# Patient Record
Sex: Male | Born: 1945
Health system: Southern US, Community
[De-identification: ages and names within clinical notes are randomized; demographics above are authoritative.]

---

## 2007-10-08 IMAGING — CR DG CHEST 2V
2 series · 2 of 2 positions shown · non-contrast
Comparison: None

CLINICAL DATA: Pre op osteoarthritis left knee

CHEST - 2 VIEW

[w chest pa]
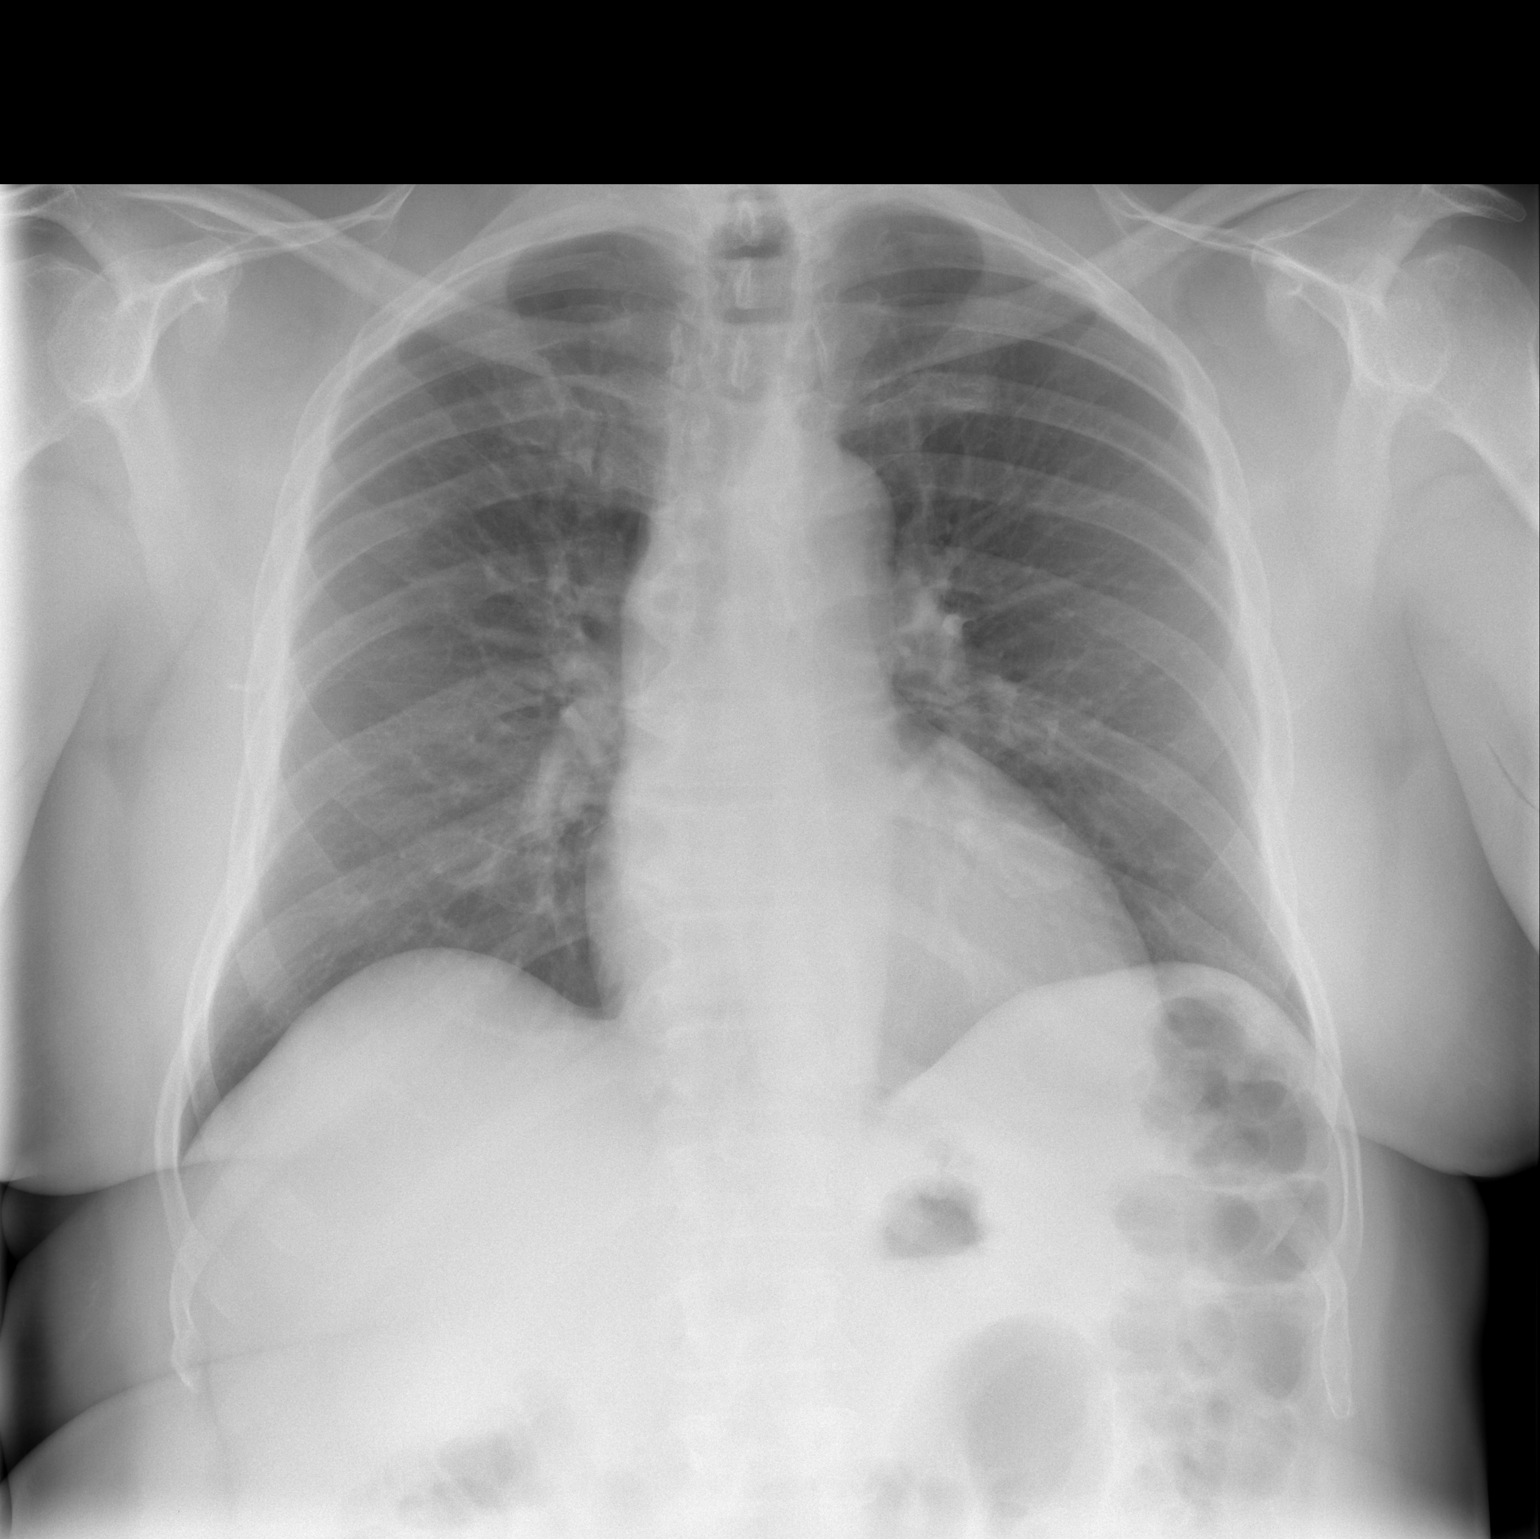

[w chest lat]
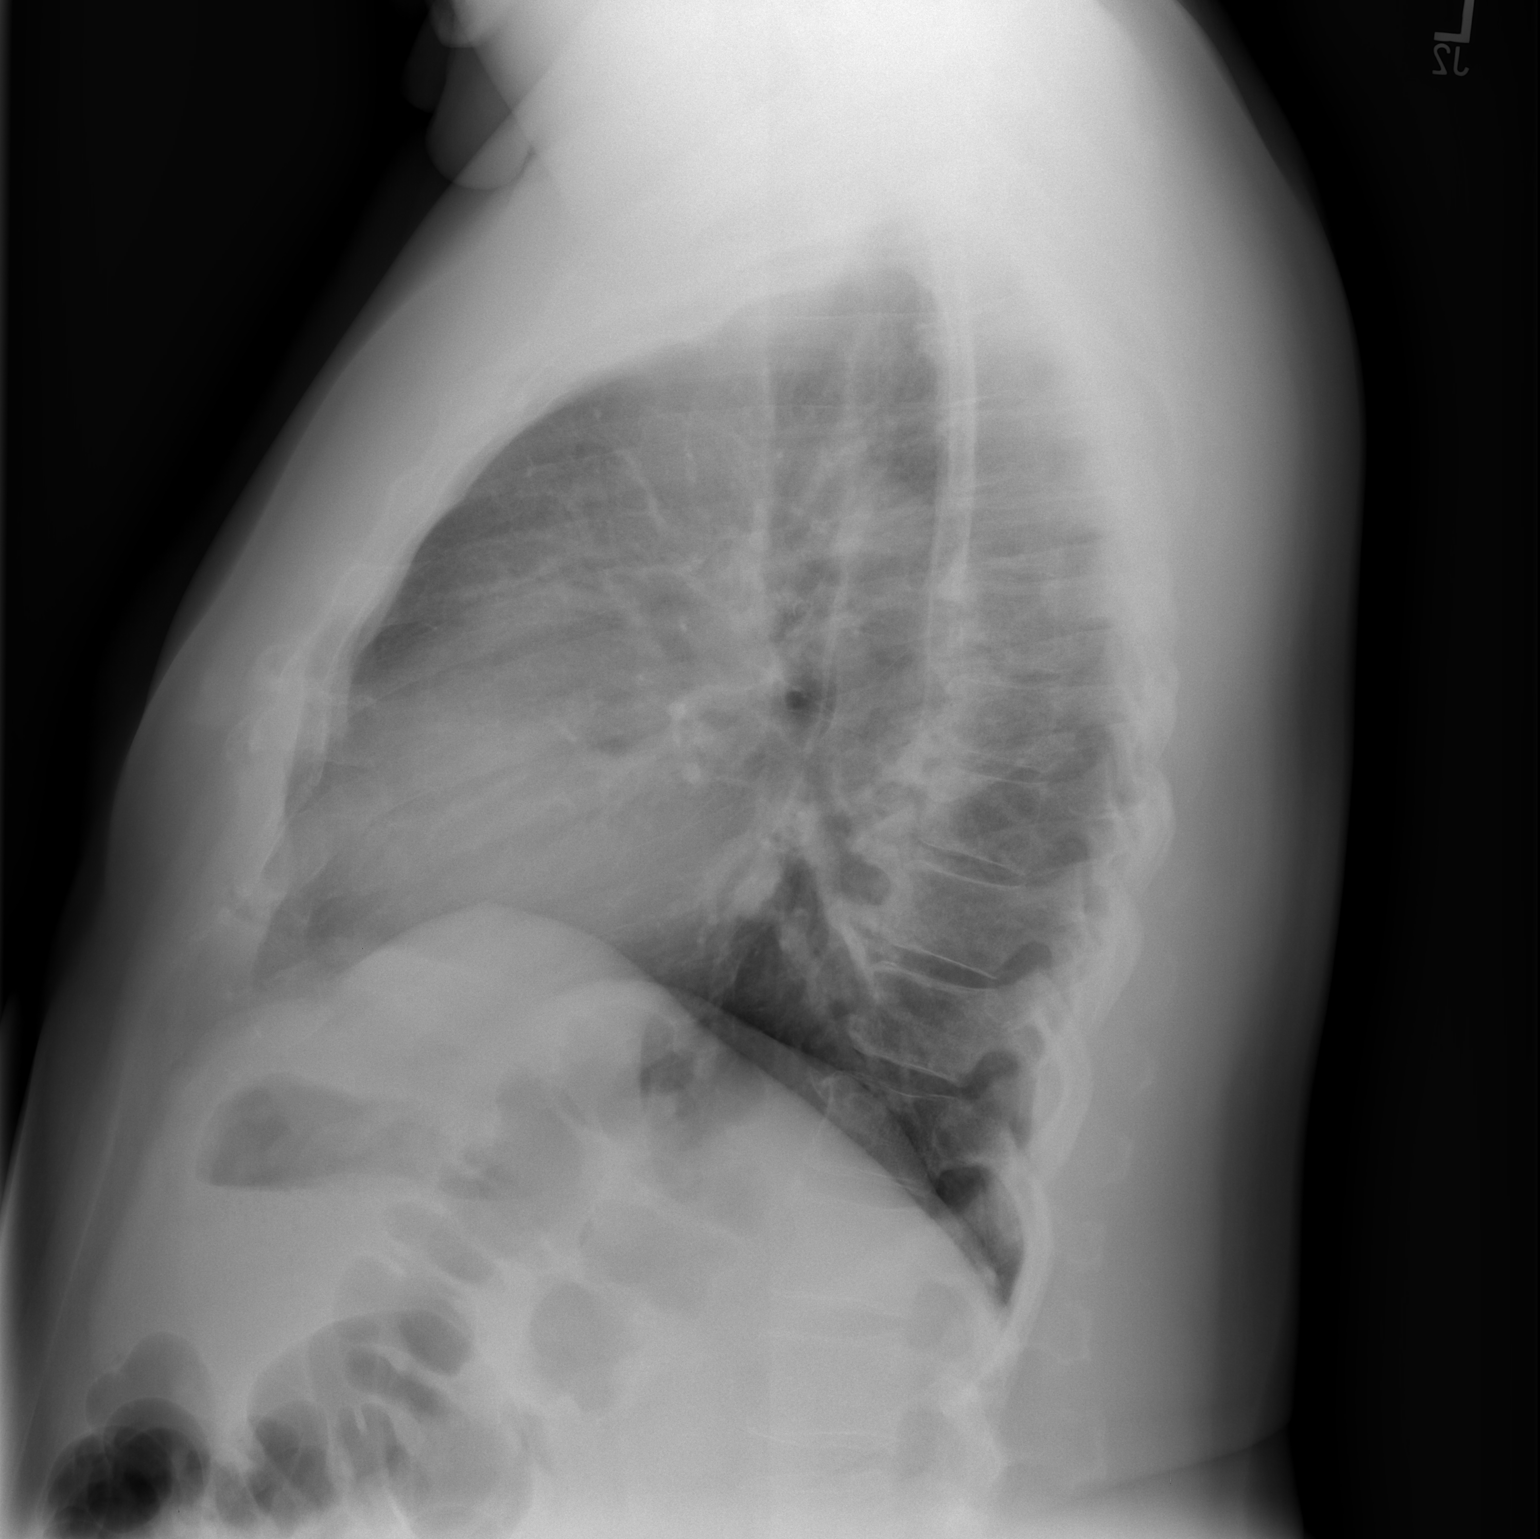

[2 of 2 positions shown; findings below may reference images not displayed]

FINDINGS: Normal mediastinum and heart silhouette.  Costophrenic
angles are clear.  No evidence of effusion, infiltrate or
pneumothorax.
IMPRESSION: 1..  No acute cardiopulmonary process.

## 2007-10-13 ENCOUNTER — Inpatient Hospital Stay (HOSPITAL_COMMUNITY): Admission: RE | Admit: 2007-10-13 | Discharge: 2007-10-16 | Payer: Self-pay | Admitting: Orthopedic Surgery

## 2010-05-23 NOTE — H&P (Signed)
James Macias, ROUTE                 ACCOUNT NO.:  [id number]   MEDICAL RECORD NO.:  [id number]          PATIENT TYPE:  INP   LOCATION:  0008                         FACILITY:  [facility]   PHYSICIAN:  Ollen Gross, M.D.    DATE OF BIRTH:  [birth year]   DATE OF ADMISSION:  10/13/2007  DATE OF DISCHARGE:                              HISTORY & PHYSICAL   CHIEF COMPLAINT:  Left knee pain.   HISTORY OF PRESENT ILLNESS:  The patient is a 65-year male who has seen  by Dr. Lequita Halt for ongoing knee problems.  He has been treated  conservatively in the past for his end-stage arthritis with injections.  He has been refractory to nonoperative management.  He is felt to be a  good candidate.  Risks and benefits have been discussed and he would  like to proceed with surgery.   ALLERGIES:  No known drug allergies.   CURRENT MEDICATIONS:  1. Actos.  2. Celebrex.  3. Lisinopril.  4. Atenolol/hydrochlorothiazide.  5. Benicar.  6. Sertraline.  7. Aspirin.   PAST MEDICAL HISTORY:  1. Early cataracts.  2. Sleep apnea which he does use a CPAP machine for.  3. Hypertension.  4. Non-insulin-dependent diabetes mellitus.  5. arthritis.   PAST SURGICAL HISTORY:  Knee scope.   FAMILY HISTORY:  Father's history is unknown.   SOCIAL HISTORY:  Married.  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  Less than a pack a day  smoker.  No alcohol.  Two children.  Does have a caregiver lined up  after surgery.  Lives in a [place] [facility].   REVIEW OF SYSTEMS:  GENERAL:  No fevers, chills or night sweats.  NEUROLOGIC:  No seizures, syncope or paralysis.  RESPIRATORY:  He does  have occasional shortness of breath at rest.  No productive cough or  hemoptysis.  CARDIOVASCULAR:  No chest pain, angina or orthopnea.  GI:  No nausea, diarrhea or constipation.  GU:  No dysuria, hematuria or  discharge.  MUSCULOSKELETAL:  Left knee.   PHYSICAL EXAMINATION:  VITAL SIGNS:  Pulse 60, respirations 14, blood  pressure 158/82.  GENERAL:  A  65 year old African American male well-nourished, well-  developed, slightly overweight in no acute distress, alert, oriented and  cooperative.  Is pleasant.  Accompanied by his wife.  HEENT:  Normocephalic, atraumatic.  Pupils equal, round and reactive.  EOM's intact.  Noted to wear glasses.  Full upper dentures.  NECK:  Supple.  CHEST:  Clear.  HEART:  Regular rate and rhythm.  No murmur.  ABDOMEN:  Soft, round, slightly protuberant abdomen.  Bowel sounds  present.  RECTAL/BREASTS/GENITALIA:  Not done.  Not pertinent to present illness  EXTREMITIES:  Left knee, trace effusion.  Range of motion 10-120.  Marked crepitus.   IMPRESSION:  Osteoarthritis left knee.   PLAN:  The patient was admitted to [facility] and will undergo  a left total knee replacement with arthroplasty.  Surgery will be  performed by Dr. Ollen Gross.      Alexzandrew L. Perkins, P.A.C.      Ollen Gross, M.D.  Electronically  Signed    ALP/MEDQ  D:  10/12/2007  T:  10/13/2007  Job:  [id number]   cc:   Dr. Mardi Mainland Card[facility]/[place], [place]

## 2010-05-23 NOTE — Op Note (Signed)
NAMESHARAD, James Macias                 ACCOUNT NO.:  [id number]   MEDICAL RECORD NO.:  [id number]          PATIENT TYPE:  INP   LOCATION:  0008                         FACILITY:  [facility]   PHYSICIAN:  Ollen Gross, M.D.    DATE OF BIRTH:  [birth year]   DATE OF PROCEDURE:  10/13/2007  DATE OF DISCHARGE:                               OPERATIVE REPORT   PREOPERATIVE DIAGNOSIS:  Osteoarthritis, left knee.   POSTOPERATIVE DIAGNOSIS:  Osteoarthritis, left knee.   PROCEDURE:  Left total knee arthroplasty.   SURGEON:  Ollen Gross, M.D.   ASSISTANT:  Jamelle Rushing, P.A.   ANESTHESIA:  General with postop Marcaine pain pump.   ESTIMATED BLOOD LOSS:  Minimal.   DRAINS:  None.   TOURNIQUET TIME:  37 minutes at 300 mmHg.   COMPLICATIONS:  None.   CONDITION:  Stable to recovery.   BRIEF CLINICAL NOTE:  James Macias is a 65 year old male with end-stage  medial compartment arthritis of his left knee with progressively  worsening pain and dysfunction.  He has had pain and recurrent  effusions.  He has failed nonoperative management and presents now for  left total knee arthroplasty.   PROCEDURE IN DETAIL:  After successful administration of a general  anesthetic, the tourniquet is placed high on his left thigh and his left  lower extremity prepped and draped in the usual sterile fashion.  The  extremity is wrapped in an Esmarch, the knee flexed, tourniquet inflated  to 300 mmHg.  A midline incision is made with a 10 blade through  subcutaneous tissue to the level of the extensor mechanism.  A fresh  blade is used to make a medial parapatellar arthrotomy.  Soft tissue  over the proximal medial tibia is subperiosteally elevated to the joint  line with a knife and into the semimembranosus bursa with a Cobb  elevator.  Soft tissue laterally is elevated with attention being paid  to avoiding the patellar tendon on the tibial tubercle.  The patella is  subluxed laterally and the knee flexed to 90  degrees, ACL and PCL are  removed.  A drill was used to create a starting hole in the distal femur  and the canal is thoroughly irrigated.  A 5-degree left valgus alignment  guide is placed and referencing off the posterior condyles, rotation is  marked and the block pinned to remove 11 mm off the distal femur.  Distal femoral resection is made with an oscillating saw.  A sizing  block is placed and a size 4 is most appropriate.  Rotation is marked at  the epicondylar axis.  A size 4 cutting block is placed and the  anterior, posterior and chamfer cuts are made.   The tibia is subluxed forward and the menisci are removed.  Extramedullary tibial alignment guide is placed referencing proximally  at the medial aspect of the tibial tubercle and distally along the  second metatarsal axis and tibial crest.  The block is pinned to remove  10 mm off the nondeficient lateral side.  Tibial resection is made with  an oscillating saw.  A size 4 is the most appropriate tibial component  and the proximal tibia is prepared with a modular drill and keel punch  for a size 4.  Femoral preparation is completed with the intercondylar  cut.   A size 4 mobile bearing tibial trial, a size 4 posterior-stabilized  femoral trial and a 10-mm posterior-stabilized rotating platform insert  trial are placed.  With the 10, full extension is achieved with  excellent varus-valgus, anterior and posterior balance throughout full  range of motion.  The patella is everted, thickness measured to be 25  mm.  Free-hand resection is taken to 13 mm, a 41 template is placed, and  lug holes are drilled, a trial patella is placed and it tracks normally.  Osteophytes are removed off the posterior femur with the trial in place.  All trials are removed and the cut bone surfaces prepared with pulsatile  lavage.  Cement is mixed and once ready for implantation, the size 4  mobile bearing tibial tray, the size 4 posterior-stabilized  femur and  the 41 patella are cemented into place and the patella is held with a  clamp.  The trial 10-mm insert is placed and the knee held in full  extension and all extruded cement removed.  When the cement had fully  hardened, then the permanent 10-mm posterior-stabilized rotating  platform insert is placed into the tibial tray.  The wound is copiously  irrigated with saline solution.  FloSeal is injected on the posterior  capsule, medial and lateral gutters and suprapatellar area.  The moist  sponge is placed and the tourniquet released with a total time of 37  minutes.  The sponge is removed after 2 minutes and minimal bleeding is  encountered.  The bleeding that is encountered is stopped with  electrocautery.  We again thoroughly irrigated the wound and closed the  arthrotomy with interrupted #1 PDS.  Flexion against gravity was 135  degrees.  Subcu is closed with interrupted 2-0 Vicryl and subcuticular  running 4-0 Monocryl.  The catheter for the Marcaine pain pump is placed  and the pump initiated.  Steri-Strips and a bulky sterile dressing are  applied and he is placed into a knee immobilizer, awakened and  transported to recovery in stable condition.      Ollen Gross, M.D.  Electronically Signed     FA/MEDQ  D:  10/13/2007  T:  10/13/2007  Job:  [id number]

## 2010-05-23 NOTE — Discharge Summary (Signed)
James Macias, James Macias                 ACCOUNT NO.:  [id number]   MEDICAL RECORD NO.:  [id number]          PATIENT TYPE:  INP   LOCATION:  1603                         [facility]ACILITY[facility]  [facility]   PHYSICIAN:  Ollen Gross, M.D.    DATE OF BIRTH:  Oct 11, 1945   DATE OF ADMISSION:  10/13/2007  DATE OF DISCHARGE:  10/16/2007                               DISCHARGE SUMMARY   ADMITTING DIAGNOSES:  1. Osteoarthritis left knee.  2. Early cataracts.  3. Sleep apnea  which he does use a CPAP machine.  4. Hypertension.  5. Non-insulin-dependent diabetes mellitus.  6. Arthritis.   DISCHARGE DIAGNOSES:  1. Osteoarthritis left knee status post left total knee replacement      arthroplasty.  2. Mild postop blood loss anemia.  3. Early cataracts.  4. Sleep apnea  which he does use a CPAP machine.  5. Hypertension.  6. Non-insulin-dependent diabetes mellitus.  7. Arthritis.   PROCEDURE:  October 13, 2007, left total knee.  Surgeon:  Dr. Lequita Halt.  Assistant:  Oneida Alar PA-C.  Anesthesia:  General.   CONSULTS:  None.   BRIEF HISTORY:  James Macias is a 65 year old male who was seen by Dr.  Lequita Halt for ongoing knee arthritis.  He has failed nonoperative  management, now presents for total knee arthroplasty.   LABORATORY DATA:  Preop CBC showed hemoglobin 12.9, hematocrit 38.3,  white cell count 6.1, platelets 246.  Chem panel on admission slightly  elevated, very minimally elevated creatinine of 1.56, slightly elevated  glucose of 114.  Remaining Chem panel within normal limits.  PT/INR 13.5  and 1.0 with a PTT of 32.  Preop UA only small leukocyte esterase, 3-6  white cells, 0-2 red cells, many bacteria.  Serial CBCs were followed  throughout the hospital course.  Hemoglobin did drop down to 9.9, then  9.7.  Last H and H showed 9.5 and 27.8.  White count remained within  normal limits.  Serial pro-times followed.  Last PT/INR 23.6 and 2.0.  Serial BMETs were followed.  Electrolytes remained within normal  limits.   Chest x-ray:  October 08, 2007, no acute cardiopulmonary process.   EKG:  We have a faxed copy of an EKG, unable to read interpretation but  it is on the chart.   HOSPITAL COURSE:  The patient admitted to [facility],  tolerated procedure well, later transferred to the recovery room  orthopedic floor.  Actually did fairly well on the evening of surgery.  In the morning of day 1, given CPAP mask to use through the night.  Started on PCA and weaned over to p.o. meds, given 24 hours of postop IV  antibiotics.  His blood pressure medications were put on parameters.  It  was running a little on the higher side in the 140s but was doing okay  postoperatively.  He was started back on his Benicar.  CBGs were  monitored.  Was placed on a modified medium carb diet.  Hemoglobin was a  little low postop at 9.70 so put him on an iron supplement.  Started  getting out  of bed on day 1.  By day 2 he was up walking distances of  350 feet.  He was doing extremely well.  Dressing change, incision  looked good.  We removed the pain pump which had been placed the time of  the surgery.  Hemoglobin was stable at 9.7.  CBGs looked excellent.  His  blood pressures were a little high on the morning of day 2 but he had  not received his blood pressure meds; those were restarted and he  continued to progress well and by 3 seen in rounds by Dr. Lequita Halt,  tolerating his meds and was discharged home.   DISCHARGE PLAN:  1. Home on October 16, 2007.  2. Discharge diagnoses, please see above.  3. Discharge meds:  Coumadin, Nu-Iron, Robaxin and Percocet.   FOLLOWUP:  Two weeks.   ACTIVITY:  Total knee protocol, weightbearing as tolerated left lower  extremity.  Home health PT and home health nursing.   DIET:  Low-sodium diabetic diet.   DISPOSITION:  Home.   CONDITION ON DISCHARGE:  Improved.      James Macias, P.A.C.      Ollen Gross, M.D.  Electronically Signed     ALP/MEDQ  D:  10/16/2007  T:  10/16/2007  Job:  [id number]   cc:   Yetta Flock, DR.  Robbie Lis CARDIOLOGY  [place], [place]   Ollen Gross, M.D.  Fax: [phone]

## 2010-10-09 LAB — COMPREHENSIVE METABOLIC PANEL
ALT: 15
BUN: 12
Calcium: 9.4
Creatinine, Ser: 1.56 — ABNORMAL HIGH
Glucose, Bld: 114 — ABNORMAL HIGH
Sodium: 142
Total Protein: 6.9

## 2010-10-09 LAB — CBC
Hemoglobin: 12.9 — ABNORMAL LOW
MCHC: 33.6
MCV: 101.3 — ABNORMAL HIGH
RDW: 14.3

## 2010-10-09 LAB — URINALYSIS, ROUTINE W REFLEX MICROSCOPIC
Nitrite: POSITIVE — AB
Protein, ur: NEGATIVE
Urobilinogen, UA: 1

## 2010-10-09 LAB — PROTIME-INR
INR: 1
Prothrombin Time: 13.5

## 2010-10-09 LAB — URINE MICROSCOPIC-ADD ON

## 2010-10-09 LAB — APTT: aPTT: 32

## 2010-10-10 LAB — BASIC METABOLIC PANEL
BUN: 12
BUN: 16
CO2: 31
CO2: 31
Calcium: 8.7
Chloride: 105
Creatinine, Ser: 1.24
Creatinine, Ser: 1.43
GFR calc Af Amer: >60
GFR calc non Af Amer: 59 — ABNORMAL LOW
Glucose, Bld: 104 — ABNORMAL HIGH
Potassium: 3.9
Sodium: 138

## 2010-10-10 LAB — GLUCOSE, CAPILLARY
Glucose-Capillary: 101 — ABNORMAL HIGH
Glucose-Capillary: 148 — ABNORMAL HIGH
Glucose-Capillary: 90
Glucose-Capillary: 90
Glucose-Capillary: 92
Glucose-Capillary: 96
Glucose-Capillary: 96
Glucose-Capillary: 97
Glucose-Capillary: 98

## 2010-10-10 LAB — CBC
HCT: 29.3 — ABNORMAL LOW
Hemoglobin: 9.7 — ABNORMAL LOW
MCHC: 33.7
MCHC: 33.8
MCV: 101.1 — ABNORMAL HIGH
MCV: 101.5 — ABNORMAL HIGH
Platelets: 188
Platelets: 196
RBC: 2.89 — ABNORMAL LOW
RDW: 13.7
RDW: 14.1
WBC: 8.3

## 2010-10-10 LAB — ABO/RH: ABO/RH(D): O POS

## 2010-10-10 LAB — PROTIME-INR
INR: 1.6 — ABNORMAL HIGH
Prothrombin Time: 15.2
Prothrombin Time: 19.6 — ABNORMAL HIGH
Prothrombin Time: 23.6 — ABNORMAL HIGH

## 2014-02-03 DIAGNOSIS — I10 Essential (primary) hypertension: Secondary | ICD-10-CM | POA: Diagnosis not present

## 2014-02-03 DIAGNOSIS — E119 Type 2 diabetes mellitus without complications: Secondary | ICD-10-CM | POA: Diagnosis not present

## 2014-02-03 DIAGNOSIS — E782 Mixed hyperlipidemia: Secondary | ICD-10-CM | POA: Diagnosis not present

## 2014-02-10 DIAGNOSIS — E119 Type 2 diabetes mellitus without complications: Secondary | ICD-10-CM | POA: Diagnosis not present

## 2014-02-10 DIAGNOSIS — I10 Essential (primary) hypertension: Secondary | ICD-10-CM | POA: Diagnosis not present

## 2014-02-10 DIAGNOSIS — E782 Mixed hyperlipidemia: Secondary | ICD-10-CM | POA: Diagnosis not present

## 2014-02-10 DIAGNOSIS — E291 Testicular hypofunction: Secondary | ICD-10-CM | POA: Diagnosis not present

## 2014-06-09 DIAGNOSIS — Z1389 Encounter for screening for other disorder: Secondary | ICD-10-CM | POA: Diagnosis not present

## 2014-06-09 DIAGNOSIS — I10 Essential (primary) hypertension: Secondary | ICD-10-CM | POA: Diagnosis not present

## 2014-06-09 DIAGNOSIS — Z139 Encounter for screening, unspecified: Secondary | ICD-10-CM | POA: Diagnosis not present

## 2014-06-09 DIAGNOSIS — E119 Type 2 diabetes mellitus without complications: Secondary | ICD-10-CM | POA: Diagnosis not present

## 2014-06-09 DIAGNOSIS — Z0000 Encounter for general adult medical examination without abnormal findings: Secondary | ICD-10-CM | POA: Diagnosis not present

## 2014-06-09 DIAGNOSIS — E782 Mixed hyperlipidemia: Secondary | ICD-10-CM | POA: Diagnosis not present

## 2014-06-17 DIAGNOSIS — E1159 Type 2 diabetes mellitus with other circulatory complications: Secondary | ICD-10-CM | POA: Diagnosis not present

## 2014-06-17 DIAGNOSIS — E119 Type 2 diabetes mellitus without complications: Secondary | ICD-10-CM | POA: Diagnosis not present

## 2014-06-17 DIAGNOSIS — E785 Hyperlipidemia, unspecified: Secondary | ICD-10-CM | POA: Diagnosis not present

## 2014-06-17 DIAGNOSIS — E1169 Type 2 diabetes mellitus with other specified complication: Secondary | ICD-10-CM | POA: Diagnosis not present

## 2014-10-12 DIAGNOSIS — E291 Testicular hypofunction: Secondary | ICD-10-CM | POA: Diagnosis not present

## 2014-10-12 DIAGNOSIS — E119 Type 2 diabetes mellitus without complications: Secondary | ICD-10-CM | POA: Diagnosis not present

## 2014-10-12 DIAGNOSIS — E1169 Type 2 diabetes mellitus with other specified complication: Secondary | ICD-10-CM | POA: Diagnosis not present

## 2014-10-12 DIAGNOSIS — E1159 Type 2 diabetes mellitus with other circulatory complications: Secondary | ICD-10-CM | POA: Diagnosis not present

## 2014-11-01 DIAGNOSIS — E1129 Type 2 diabetes mellitus with other diabetic kidney complication: Secondary | ICD-10-CM | POA: Diagnosis not present

## 2014-11-01 DIAGNOSIS — E1159 Type 2 diabetes mellitus with other circulatory complications: Secondary | ICD-10-CM | POA: Diagnosis not present

## 2014-11-01 DIAGNOSIS — E785 Hyperlipidemia, unspecified: Secondary | ICD-10-CM | POA: Diagnosis not present

## 2014-11-01 DIAGNOSIS — Z23 Encounter for immunization: Secondary | ICD-10-CM | POA: Diagnosis not present

## 2014-11-01 DIAGNOSIS — E1169 Type 2 diabetes mellitus with other specified complication: Secondary | ICD-10-CM | POA: Diagnosis not present

## 2015-07-05 DIAGNOSIS — E1159 Type 2 diabetes mellitus with other circulatory complications: Secondary | ICD-10-CM | POA: Diagnosis not present

## 2015-07-05 DIAGNOSIS — E291 Testicular hypofunction: Secondary | ICD-10-CM | POA: Diagnosis not present

## 2015-07-05 DIAGNOSIS — E1169 Type 2 diabetes mellitus with other specified complication: Secondary | ICD-10-CM | POA: Diagnosis not present

## 2015-07-05 DIAGNOSIS — E1129 Type 2 diabetes mellitus with other diabetic kidney complication: Secondary | ICD-10-CM | POA: Diagnosis not present

## 2015-07-11 DIAGNOSIS — E782 Mixed hyperlipidemia: Secondary | ICD-10-CM | POA: Diagnosis not present

## 2015-07-11 DIAGNOSIS — E1129 Type 2 diabetes mellitus with other diabetic kidney complication: Secondary | ICD-10-CM | POA: Diagnosis not present

## 2015-07-11 DIAGNOSIS — E1159 Type 2 diabetes mellitus with other circulatory complications: Secondary | ICD-10-CM | POA: Diagnosis not present

## 2015-07-11 DIAGNOSIS — I10 Essential (primary) hypertension: Secondary | ICD-10-CM | POA: Diagnosis not present

## 2015-08-16 DIAGNOSIS — I10 Essential (primary) hypertension: Secondary | ICD-10-CM | POA: Diagnosis not present

## 2015-08-16 DIAGNOSIS — E1159 Type 2 diabetes mellitus with other circulatory complications: Secondary | ICD-10-CM | POA: Diagnosis not present

## 2015-11-16 DIAGNOSIS — E1169 Type 2 diabetes mellitus with other specified complication: Secondary | ICD-10-CM | POA: Diagnosis not present

## 2015-11-16 DIAGNOSIS — Z23 Encounter for immunization: Secondary | ICD-10-CM | POA: Diagnosis not present

## 2015-11-16 DIAGNOSIS — E1129 Type 2 diabetes mellitus with other diabetic kidney complication: Secondary | ICD-10-CM | POA: Diagnosis not present

## 2015-11-16 DIAGNOSIS — E1159 Type 2 diabetes mellitus with other circulatory complications: Secondary | ICD-10-CM | POA: Diagnosis not present

## 2015-11-28 DIAGNOSIS — E1169 Type 2 diabetes mellitus with other specified complication: Secondary | ICD-10-CM | POA: Diagnosis not present

## 2015-11-28 DIAGNOSIS — E785 Hyperlipidemia, unspecified: Secondary | ICD-10-CM | POA: Diagnosis not present

## 2015-11-28 DIAGNOSIS — E1129 Type 2 diabetes mellitus with other diabetic kidney complication: Secondary | ICD-10-CM | POA: Diagnosis not present

## 2015-11-28 DIAGNOSIS — I10 Essential (primary) hypertension: Secondary | ICD-10-CM | POA: Diagnosis not present

## 2016-05-02 DIAGNOSIS — E1169 Type 2 diabetes mellitus with other specified complication: Secondary | ICD-10-CM | POA: Diagnosis not present

## 2016-05-02 DIAGNOSIS — E1129 Type 2 diabetes mellitus with other diabetic kidney complication: Secondary | ICD-10-CM | POA: Diagnosis not present

## 2016-05-02 DIAGNOSIS — E1159 Type 2 diabetes mellitus with other circulatory complications: Secondary | ICD-10-CM | POA: Diagnosis not present

## 2016-05-09 DIAGNOSIS — Z139 Encounter for screening, unspecified: Secondary | ICD-10-CM | POA: Diagnosis not present

## 2016-05-09 DIAGNOSIS — E1159 Type 2 diabetes mellitus with other circulatory complications: Secondary | ICD-10-CM | POA: Diagnosis not present

## 2016-05-09 DIAGNOSIS — E1129 Type 2 diabetes mellitus with other diabetic kidney complication: Secondary | ICD-10-CM | POA: Diagnosis not present

## 2016-05-09 DIAGNOSIS — I10 Essential (primary) hypertension: Secondary | ICD-10-CM | POA: Diagnosis not present

## 2016-05-09 DIAGNOSIS — E1169 Type 2 diabetes mellitus with other specified complication: Secondary | ICD-10-CM | POA: Diagnosis not present

## 2016-05-09 DIAGNOSIS — Z0000 Encounter for general adult medical examination without abnormal findings: Secondary | ICD-10-CM | POA: Diagnosis not present

## 2016-05-09 DIAGNOSIS — Z1389 Encounter for screening for other disorder: Secondary | ICD-10-CM | POA: Diagnosis not present

## 2016-05-30 DIAGNOSIS — Z0271 Encounter for disability determination: Secondary | ICD-10-CM | POA: Diagnosis not present

## 2016-09-12 DIAGNOSIS — E782 Mixed hyperlipidemia: Secondary | ICD-10-CM | POA: Diagnosis not present

## 2016-09-12 DIAGNOSIS — Z125 Encounter for screening for malignant neoplasm of prostate: Secondary | ICD-10-CM | POA: Diagnosis not present

## 2016-09-12 DIAGNOSIS — E1169 Type 2 diabetes mellitus with other specified complication: Secondary | ICD-10-CM | POA: Diagnosis not present

## 2016-09-12 DIAGNOSIS — E1159 Type 2 diabetes mellitus with other circulatory complications: Secondary | ICD-10-CM | POA: Diagnosis not present

## 2016-09-12 DIAGNOSIS — R7989 Other specified abnormal findings of blood chemistry: Secondary | ICD-10-CM | POA: Diagnosis not present

## 2016-09-24 DIAGNOSIS — E1159 Type 2 diabetes mellitus with other circulatory complications: Secondary | ICD-10-CM | POA: Diagnosis not present

## 2016-09-24 DIAGNOSIS — Z139 Encounter for screening, unspecified: Secondary | ICD-10-CM | POA: Diagnosis not present

## 2016-09-24 DIAGNOSIS — E782 Mixed hyperlipidemia: Secondary | ICD-10-CM | POA: Diagnosis not present

## 2016-09-24 DIAGNOSIS — Z1389 Encounter for screening for other disorder: Secondary | ICD-10-CM | POA: Diagnosis not present

## 2016-09-24 DIAGNOSIS — Z720 Tobacco use: Secondary | ICD-10-CM | POA: Diagnosis not present

## 2016-09-24 DIAGNOSIS — E1129 Type 2 diabetes mellitus with other diabetic kidney complication: Secondary | ICD-10-CM | POA: Diagnosis not present

## 2016-09-24 DIAGNOSIS — I10 Essential (primary) hypertension: Secondary | ICD-10-CM | POA: Diagnosis not present

## 2016-10-12 DIAGNOSIS — N182 Chronic kidney disease, stage 2 (mild): Secondary | ICD-10-CM | POA: Diagnosis not present

## 2016-10-19 DIAGNOSIS — E1129 Type 2 diabetes mellitus with other diabetic kidney complication: Secondary | ICD-10-CM | POA: Diagnosis not present

## 2016-10-19 DIAGNOSIS — M5430 Sciatica, unspecified side: Secondary | ICD-10-CM | POA: Diagnosis not present

## 2016-10-19 DIAGNOSIS — Z23 Encounter for immunization: Secondary | ICD-10-CM | POA: Diagnosis not present

## 2016-10-19 DIAGNOSIS — M545 Low back pain: Secondary | ICD-10-CM | POA: Diagnosis not present

## 2016-10-19 DIAGNOSIS — Z7189 Other specified counseling: Secondary | ICD-10-CM | POA: Diagnosis not present

## 2016-11-02 DIAGNOSIS — M47816 Spondylosis without myelopathy or radiculopathy, lumbar region: Secondary | ICD-10-CM | POA: Diagnosis not present

## 2016-11-02 DIAGNOSIS — M5430 Sciatica, unspecified side: Secondary | ICD-10-CM | POA: Diagnosis not present

## 2016-11-13 DIAGNOSIS — E1129 Type 2 diabetes mellitus with other diabetic kidney complication: Secondary | ICD-10-CM | POA: Diagnosis not present

## 2016-11-13 DIAGNOSIS — Z789 Other specified health status: Secondary | ICD-10-CM | POA: Diagnosis not present

## 2016-11-13 DIAGNOSIS — E782 Mixed hyperlipidemia: Secondary | ICD-10-CM | POA: Diagnosis not present

## 2016-12-12 DIAGNOSIS — E1159 Type 2 diabetes mellitus with other circulatory complications: Secondary | ICD-10-CM | POA: Diagnosis not present

## 2016-12-12 DIAGNOSIS — E782 Mixed hyperlipidemia: Secondary | ICD-10-CM | POA: Diagnosis not present

## 2016-12-12 DIAGNOSIS — E1169 Type 2 diabetes mellitus with other specified complication: Secondary | ICD-10-CM | POA: Diagnosis not present

## 2016-12-26 DIAGNOSIS — E1129 Type 2 diabetes mellitus with other diabetic kidney complication: Secondary | ICD-10-CM | POA: Diagnosis not present

## 2016-12-26 DIAGNOSIS — E782 Mixed hyperlipidemia: Secondary | ICD-10-CM | POA: Diagnosis not present

## 2016-12-26 DIAGNOSIS — I10 Essential (primary) hypertension: Secondary | ICD-10-CM | POA: Diagnosis not present

## 2016-12-26 DIAGNOSIS — E1159 Type 2 diabetes mellitus with other circulatory complications: Secondary | ICD-10-CM | POA: Diagnosis not present

## 2017-02-20 DIAGNOSIS — E1129 Type 2 diabetes mellitus with other diabetic kidney complication: Secondary | ICD-10-CM | POA: Diagnosis not present

## 2017-02-20 DIAGNOSIS — E1169 Type 2 diabetes mellitus with other specified complication: Secondary | ICD-10-CM | POA: Diagnosis not present

## 2017-02-20 DIAGNOSIS — E785 Hyperlipidemia, unspecified: Secondary | ICD-10-CM | POA: Diagnosis not present

## 2017-02-20 DIAGNOSIS — E1159 Type 2 diabetes mellitus with other circulatory complications: Secondary | ICD-10-CM | POA: Diagnosis not present

## 2017-04-19 DIAGNOSIS — E1159 Type 2 diabetes mellitus with other circulatory complications: Secondary | ICD-10-CM | POA: Diagnosis not present

## 2017-04-19 DIAGNOSIS — E1129 Type 2 diabetes mellitus with other diabetic kidney complication: Secondary | ICD-10-CM | POA: Diagnosis not present

## 2017-04-19 DIAGNOSIS — E782 Mixed hyperlipidemia: Secondary | ICD-10-CM | POA: Diagnosis not present

## 2017-04-26 DIAGNOSIS — I10 Essential (primary) hypertension: Secondary | ICD-10-CM | POA: Diagnosis not present

## 2017-04-26 DIAGNOSIS — E1159 Type 2 diabetes mellitus with other circulatory complications: Secondary | ICD-10-CM | POA: Diagnosis not present

## 2017-04-26 DIAGNOSIS — E782 Mixed hyperlipidemia: Secondary | ICD-10-CM | POA: Diagnosis not present

## 2017-04-26 DIAGNOSIS — E1129 Type 2 diabetes mellitus with other diabetic kidney complication: Secondary | ICD-10-CM | POA: Diagnosis not present

## 2017-06-07 DIAGNOSIS — N182 Chronic kidney disease, stage 2 (mild): Secondary | ICD-10-CM | POA: Diagnosis not present

## 2017-06-07 DIAGNOSIS — E1129 Type 2 diabetes mellitus with other diabetic kidney complication: Secondary | ICD-10-CM | POA: Diagnosis not present

## 2017-06-28 DIAGNOSIS — M5416 Radiculopathy, lumbar region: Secondary | ICD-10-CM | POA: Diagnosis not present

## 2017-07-17 DIAGNOSIS — M5416 Radiculopathy, lumbar region: Secondary | ICD-10-CM | POA: Diagnosis not present

## 2017-07-17 DIAGNOSIS — Z139 Encounter for screening, unspecified: Secondary | ICD-10-CM | POA: Diagnosis not present

## 2017-07-17 DIAGNOSIS — Z1331 Encounter for screening for depression: Secondary | ICD-10-CM | POA: Diagnosis not present

## 2017-07-26 DIAGNOSIS — M5416 Radiculopathy, lumbar region: Secondary | ICD-10-CM | POA: Diagnosis not present

## 2017-07-26 DIAGNOSIS — M545 Low back pain: Secondary | ICD-10-CM | POA: Diagnosis not present

## 2017-07-26 DIAGNOSIS — M4316 Spondylolisthesis, lumbar region: Secondary | ICD-10-CM | POA: Diagnosis not present

## 2017-08-02 DIAGNOSIS — M545 Low back pain: Secondary | ICD-10-CM | POA: Diagnosis not present

## 2017-08-12 DIAGNOSIS — M5136 Other intervertebral disc degeneration, lumbar region: Secondary | ICD-10-CM | POA: Diagnosis not present

## 2017-08-12 DIAGNOSIS — M5416 Radiculopathy, lumbar region: Secondary | ICD-10-CM | POA: Diagnosis not present

## 2017-08-14 ENCOUNTER — Other Ambulatory Visit: Payer: Self-pay | Admitting: Physician Assistant

## 2017-08-14 DIAGNOSIS — M5416 Radiculopathy, lumbar region: Secondary | ICD-10-CM

## 2017-08-19 ENCOUNTER — Ambulatory Visit
Admission: RE | Admit: 2017-08-19 | Discharge: 2017-08-19 | Disposition: A | Discharge status: Home / Self Care | Payer: Medicare Other | Source: Ambulatory Visit | Attending: Physician Assistant | Admitting: Physician Assistant

## 2017-08-19 DIAGNOSIS — M5126 Other intervertebral disc displacement, lumbar region: Secondary | ICD-10-CM | POA: Diagnosis not present

## 2017-08-19 DIAGNOSIS — M5416 Radiculopathy, lumbar region: Secondary | ICD-10-CM

## 2017-08-19 IMAGING — XA DG EPIDURAL/NERVE ROOT
2 series · 2 of 2 positions shown · non-contrast
Comparison: none

CLINICAL DATA: Right lower extremity radiculopathy. Displacement of
the L5-S1 lumbar disc.

[Series 1: ortho standard · 1 of 1 slices shown (1 of 2)]
[im 1/1]
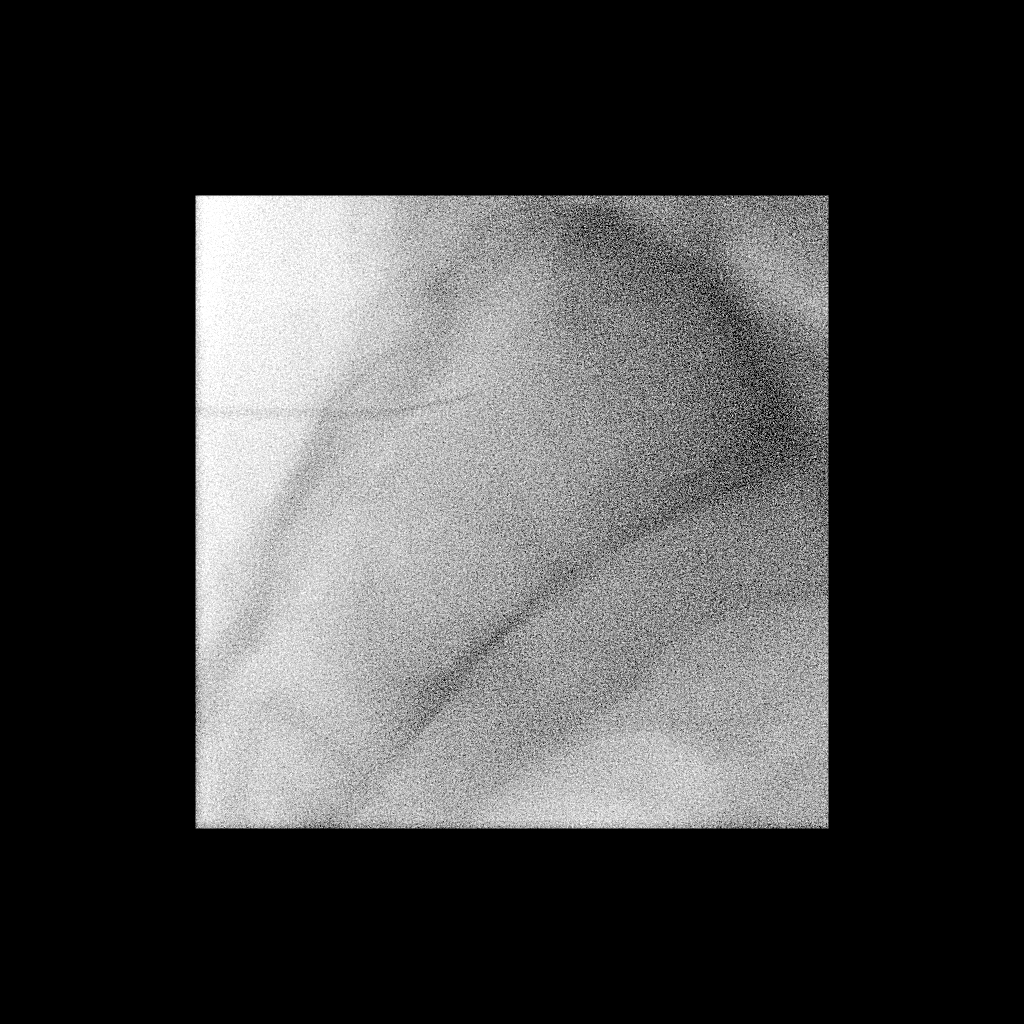

[Series 2: ortho standard · 1 of 1 slices shown (2 of 2)]
[im 1/1]
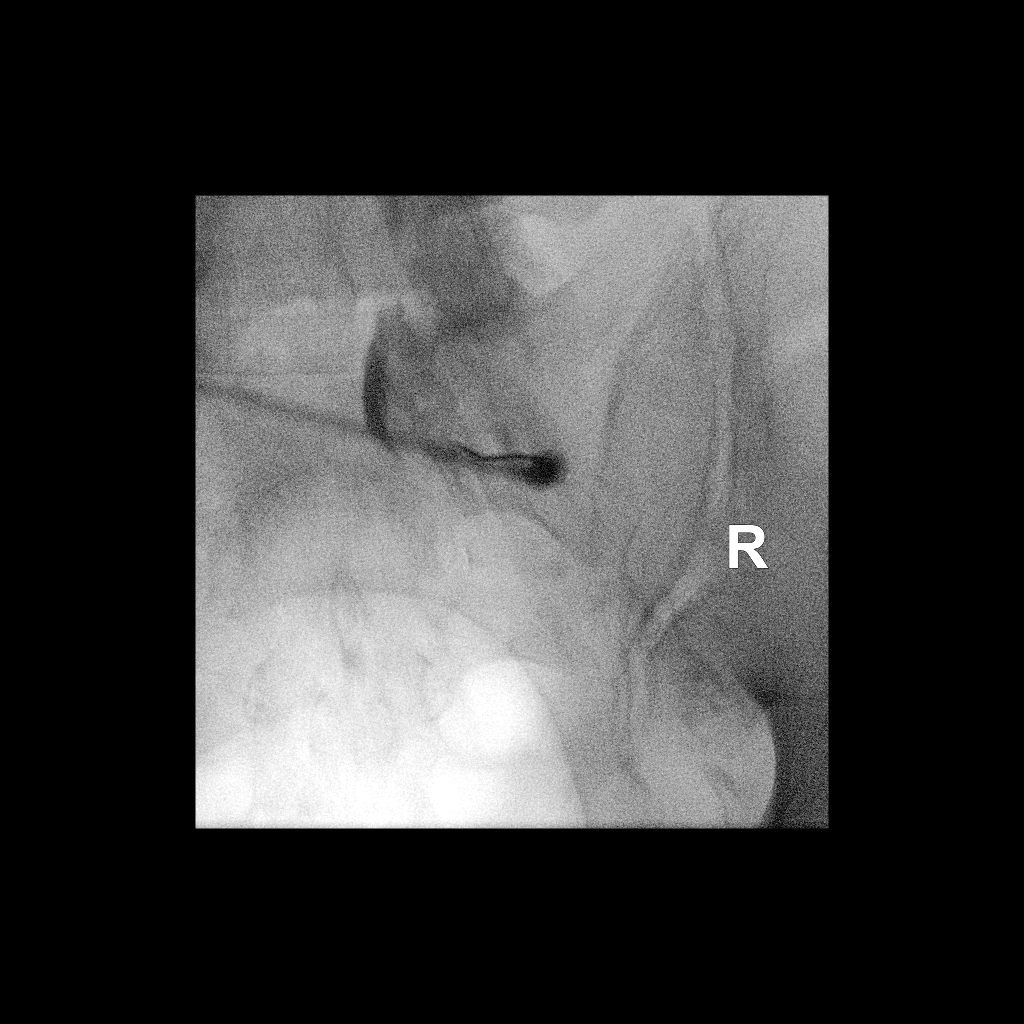

[2 of 2 positions shown; findings below may reference images not displayed]

FLUOROSCOPY TIME:  Radiation Exposure Index (as provided by the
fluoroscopic device): 8747 uGy*m2

Fluoroscopy Time:  21 seconds.

Number of Acquired Images:  0

PROCEDURE:
SELECTIVE NERVE ROOT AND TRANSFORAMINAL EPIDURAL STEROID INJECTION:

A transforaminal approach was performed on the right at the S1
foramen. The overlying skin was cleansed and anesthetized. A 22
gauge spinal needle was advanced into the foramen from a lateral
oblique approach. Injection of 2cc of Isovue-M 200 outlined the
nerve root and extended into the epidural space. No vascular or
intrathecal spread is evident.

I then injected 120 mg of Depo-Medrol and 1.5 ml of 1% lidocaine.
The patient tolerated the procedure without evidence for
complication.

The patient was observed for 20 minutes prior to discharge in stable
neurologic condition.
IMPRESSION: Technically successful selective nerve root block and transforaminal
epidural steroid injection on theright at the S1 foramen.

## 2017-08-19 MED ORDER — METHYLPREDNISOLONE ACETATE 40 MG/ML INJ SUSP (RADIOLOG
120.0000 mg | Freq: Once | INTRAMUSCULAR | Status: AC
  Administered 2017-08-19: 120 mg via EPIDURAL

## 2017-08-19 MED ORDER — IOPAMIDOL (ISOVUE-M 200) INJECTION 41%
1.0000 mL | Freq: Once | INTRAMUSCULAR | Status: AC
  Administered 2017-08-19: 1 mL via EPIDURAL

## 2017-08-19 NOTE — Discharge Instructions (Signed)

## 2017-08-23 DIAGNOSIS — E1169 Type 2 diabetes mellitus with other specified complication: Secondary | ICD-10-CM | POA: Diagnosis not present

## 2017-08-23 DIAGNOSIS — E1159 Type 2 diabetes mellitus with other circulatory complications: Secondary | ICD-10-CM | POA: Diagnosis not present

## 2017-08-27 DIAGNOSIS — M79661 Pain in right lower leg: Secondary | ICD-10-CM | POA: Diagnosis not present

## 2017-08-27 DIAGNOSIS — M79662 Pain in left lower leg: Secondary | ICD-10-CM | POA: Diagnosis not present

## 2017-08-27 DIAGNOSIS — M4807 Spinal stenosis, lumbosacral region: Secondary | ICD-10-CM | POA: Diagnosis not present

## 2017-08-27 DIAGNOSIS — R531 Weakness: Secondary | ICD-10-CM | POA: Diagnosis not present

## 2017-09-03 DIAGNOSIS — R531 Weakness: Secondary | ICD-10-CM | POA: Diagnosis not present

## 2017-09-03 DIAGNOSIS — M79662 Pain in left lower leg: Secondary | ICD-10-CM | POA: Diagnosis not present

## 2017-09-03 DIAGNOSIS — M4807 Spinal stenosis, lumbosacral region: Secondary | ICD-10-CM | POA: Diagnosis not present

## 2017-09-03 DIAGNOSIS — M79661 Pain in right lower leg: Secondary | ICD-10-CM | POA: Diagnosis not present

## 2017-09-04 DIAGNOSIS — E1129 Type 2 diabetes mellitus with other diabetic kidney complication: Secondary | ICD-10-CM | POA: Diagnosis not present

## 2017-09-04 DIAGNOSIS — I10 Essential (primary) hypertension: Secondary | ICD-10-CM | POA: Diagnosis not present

## 2017-09-04 DIAGNOSIS — E1169 Type 2 diabetes mellitus with other specified complication: Secondary | ICD-10-CM | POA: Diagnosis not present

## 2017-09-04 DIAGNOSIS — Z23 Encounter for immunization: Secondary | ICD-10-CM | POA: Diagnosis not present

## 2017-09-04 DIAGNOSIS — E1159 Type 2 diabetes mellitus with other circulatory complications: Secondary | ICD-10-CM | POA: Diagnosis not present

## 2017-09-05 DIAGNOSIS — M79661 Pain in right lower leg: Secondary | ICD-10-CM | POA: Diagnosis not present

## 2017-09-05 DIAGNOSIS — M79662 Pain in left lower leg: Secondary | ICD-10-CM | POA: Diagnosis not present

## 2017-09-05 DIAGNOSIS — M4807 Spinal stenosis, lumbosacral region: Secondary | ICD-10-CM | POA: Diagnosis not present

## 2017-09-05 DIAGNOSIS — R531 Weakness: Secondary | ICD-10-CM | POA: Diagnosis not present

## 2017-09-10 DIAGNOSIS — M79661 Pain in right lower leg: Secondary | ICD-10-CM | POA: Diagnosis not present

## 2017-09-10 DIAGNOSIS — M4807 Spinal stenosis, lumbosacral region: Secondary | ICD-10-CM | POA: Diagnosis not present

## 2017-09-10 DIAGNOSIS — R531 Weakness: Secondary | ICD-10-CM | POA: Diagnosis not present

## 2017-09-10 DIAGNOSIS — M79662 Pain in left lower leg: Secondary | ICD-10-CM | POA: Diagnosis not present

## 2017-09-17 DIAGNOSIS — M4807 Spinal stenosis, lumbosacral region: Secondary | ICD-10-CM | POA: Diagnosis not present

## 2017-09-17 DIAGNOSIS — M79662 Pain in left lower leg: Secondary | ICD-10-CM | POA: Diagnosis not present

## 2017-09-17 DIAGNOSIS — M79661 Pain in right lower leg: Secondary | ICD-10-CM | POA: Diagnosis not present

## 2017-09-17 DIAGNOSIS — R531 Weakness: Secondary | ICD-10-CM | POA: Diagnosis not present

## 2017-09-19 DIAGNOSIS — R531 Weakness: Secondary | ICD-10-CM | POA: Diagnosis not present

## 2017-09-19 DIAGNOSIS — M4807 Spinal stenosis, lumbosacral region: Secondary | ICD-10-CM | POA: Diagnosis not present

## 2017-09-19 DIAGNOSIS — M79662 Pain in left lower leg: Secondary | ICD-10-CM | POA: Diagnosis not present

## 2017-09-19 DIAGNOSIS — M79661 Pain in right lower leg: Secondary | ICD-10-CM | POA: Diagnosis not present

## 2017-09-24 DIAGNOSIS — R531 Weakness: Secondary | ICD-10-CM | POA: Diagnosis not present

## 2017-09-24 DIAGNOSIS — M4807 Spinal stenosis, lumbosacral region: Secondary | ICD-10-CM | POA: Diagnosis not present

## 2017-09-24 DIAGNOSIS — M79661 Pain in right lower leg: Secondary | ICD-10-CM | POA: Diagnosis not present

## 2017-09-24 DIAGNOSIS — M79662 Pain in left lower leg: Secondary | ICD-10-CM | POA: Diagnosis not present

## 2017-09-26 DIAGNOSIS — M79662 Pain in left lower leg: Secondary | ICD-10-CM | POA: Diagnosis not present

## 2017-09-26 DIAGNOSIS — R531 Weakness: Secondary | ICD-10-CM | POA: Diagnosis not present

## 2017-09-26 DIAGNOSIS — M4807 Spinal stenosis, lumbosacral region: Secondary | ICD-10-CM | POA: Diagnosis not present

## 2017-09-26 DIAGNOSIS — M79661 Pain in right lower leg: Secondary | ICD-10-CM | POA: Diagnosis not present

## 2017-10-10 ENCOUNTER — Other Ambulatory Visit: Payer: Self-pay

## 2017-10-10 NOTE — Patient Outreach (Signed)
[facility]) Care Management  10/10/2017  James Macias January 22, 1945 [id number]   Medication Adherence call to James Macias spoke with patient he said he gets a pill pack every month from the pharmacy and does not need any at this time patient is no longer taking Pioglitazone 15 mg ,he is still taking Pravastatin 20 mg , Lisinopril 40 mg and Metformin 500 mg. James Macias is showing past due under [employer].   Lillia Abed CPhT Pharmacy Technician [facility] Management Direct Dial [phone]  Fax [phone] [email or web address]

## 2017-12-24 DIAGNOSIS — E1169 Type 2 diabetes mellitus with other specified complication: Secondary | ICD-10-CM | POA: Diagnosis not present

## 2017-12-24 DIAGNOSIS — E1159 Type 2 diabetes mellitus with other circulatory complications: Secondary | ICD-10-CM | POA: Diagnosis not present

## 2018-01-10 DIAGNOSIS — E1159 Type 2 diabetes mellitus with other circulatory complications: Secondary | ICD-10-CM | POA: Diagnosis not present

## 2018-01-10 DIAGNOSIS — E1169 Type 2 diabetes mellitus with other specified complication: Secondary | ICD-10-CM | POA: Diagnosis not present

## 2018-01-10 DIAGNOSIS — I10 Essential (primary) hypertension: Secondary | ICD-10-CM | POA: Diagnosis not present

## 2018-01-10 DIAGNOSIS — Z0000 Encounter for general adult medical examination without abnormal findings: Secondary | ICD-10-CM | POA: Diagnosis not present

## 2018-01-10 DIAGNOSIS — E1129 Type 2 diabetes mellitus with other diabetic kidney complication: Secondary | ICD-10-CM | POA: Diagnosis not present

## 2018-01-10 DIAGNOSIS — Z7189 Other specified counseling: Secondary | ICD-10-CM | POA: Diagnosis not present

## 2018-02-07 DIAGNOSIS — E1159 Type 2 diabetes mellitus with other circulatory complications: Secondary | ICD-10-CM | POA: Diagnosis not present

## 2018-02-14 DIAGNOSIS — I129 Hypertensive chronic kidney disease with stage 1 through stage 4 chronic kidney disease, or unspecified chronic kidney disease: Secondary | ICD-10-CM | POA: Diagnosis not present

## 2018-02-14 DIAGNOSIS — N179 Acute kidney failure, unspecified: Secondary | ICD-10-CM | POA: Diagnosis not present

## 2018-02-14 DIAGNOSIS — N189 Chronic kidney disease, unspecified: Secondary | ICD-10-CM | POA: Diagnosis not present

## 2018-03-05 ENCOUNTER — Other Ambulatory Visit: Payer: Self-pay

## 2018-03-05 NOTE — Patient Outreach (Signed)
[facility] service [facility]) Care Management  03/05/2018  [name] [birth year] [id number]   Medication Adherence call to Mr. James Macias patient did not answer patient is due on Lisinopril 40 mg. Pharmacy said last pick up was on 01/24/18 for a 30 days supply. [name] is showing past due under [employer].   Lillia Abed CPhT Pharmacy Technician [facility] Care Management Direct Dial [phone]  Fax [phone] [email or web address]

## 2018-04-08 ENCOUNTER — Other Ambulatory Visit: Payer: Self-pay

## 2018-04-08 NOTE — Patient Outreach (Signed)
[facility] Customer service manager [facility]) Care Management  04/08/2018  JAVARIOUS HUARD 29-May-1945 [id number]   Medication Adherence call to Mr. Vernell Leep patient answer and provide with his information and hung up call patient back but did not answer, when to his voice mail HIPPA Compliant Voice message left with a call back number. Mr. Estabrooks is due on Lisinopril 40 mg under [employer].   Lillia Abed CPhT Pharmacy Technician [facility] Care Management Direct Dial [phone]  Fax [phone] [email or web address]

## 2018-05-05 DIAGNOSIS — E1169 Type 2 diabetes mellitus with other specified complication: Secondary | ICD-10-CM | POA: Diagnosis not present

## 2018-05-05 DIAGNOSIS — E1159 Type 2 diabetes mellitus with other circulatory complications: Secondary | ICD-10-CM | POA: Diagnosis not present

## 2018-05-12 DIAGNOSIS — E1159 Type 2 diabetes mellitus with other circulatory complications: Secondary | ICD-10-CM | POA: Diagnosis not present

## 2018-05-12 DIAGNOSIS — E119 Type 2 diabetes mellitus without complications: Secondary | ICD-10-CM | POA: Diagnosis not present

## 2018-05-12 DIAGNOSIS — E1169 Type 2 diabetes mellitus with other specified complication: Secondary | ICD-10-CM | POA: Diagnosis not present

## 2018-05-12 DIAGNOSIS — I10 Essential (primary) hypertension: Secondary | ICD-10-CM | POA: Diagnosis not present

## 2018-05-19 ENCOUNTER — Other Ambulatory Visit: Payer: Self-pay

## 2018-05-19 NOTE — Patient Outreach (Signed)
[facility]) Care Management  05/19/2018  James Macias 1945-06-29 [id number]   Medication Adherence call to James Macias [employer] Verify spoke with patient he is due on Lisinopril 40 mg and Metformin 500 mg he is still taking 1 tablet daily on both medication and does not need any at this time patient receives a pill pack every month. James Macias is showing past due under [employer].   James Macias CPhT Pharmacy Technician [facility] Care Management Direct Dial [phone]  Fax [phone] [email or web address]

## 2018-07-15 DIAGNOSIS — N179 Acute kidney failure, unspecified: Secondary | ICD-10-CM | POA: Diagnosis not present

## 2018-07-15 DIAGNOSIS — J9811 Atelectasis: Secondary | ICD-10-CM | POA: Diagnosis not present

## 2018-07-15 DIAGNOSIS — R001 Bradycardia, unspecified: Secondary | ICD-10-CM | POA: Diagnosis not present

## 2018-07-15 DIAGNOSIS — E785 Hyperlipidemia, unspecified: Secondary | ICD-10-CM | POA: Diagnosis not present

## 2018-07-15 DIAGNOSIS — Z7982 Long term (current) use of aspirin: Secondary | ICD-10-CM | POA: Diagnosis not present

## 2018-07-15 DIAGNOSIS — E1110 Type 2 diabetes mellitus with ketoacidosis without coma: Secondary | ICD-10-CM | POA: Diagnosis not present

## 2018-07-15 DIAGNOSIS — I10 Essential (primary) hypertension: Secondary | ICD-10-CM | POA: Diagnosis not present

## 2018-07-15 DIAGNOSIS — R9431 Abnormal electrocardiogram [ECG] [EKG]: Secondary | ICD-10-CM | POA: Diagnosis not present

## 2018-07-15 DIAGNOSIS — Z79899 Other long term (current) drug therapy: Secondary | ICD-10-CM | POA: Diagnosis not present

## 2018-07-15 DIAGNOSIS — Z7984 Long term (current) use of oral hypoglycemic drugs: Secondary | ICD-10-CM | POA: Diagnosis not present

## 2018-07-15 DIAGNOSIS — E860 Dehydration: Secondary | ICD-10-CM | POA: Diagnosis not present

## 2018-07-16 DIAGNOSIS — E1165 Type 2 diabetes mellitus with hyperglycemia: Secondary | ICD-10-CM

## 2018-07-16 DIAGNOSIS — I10 Essential (primary) hypertension: Secondary | ICD-10-CM

## 2018-07-16 DIAGNOSIS — E785 Hyperlipidemia, unspecified: Secondary | ICD-10-CM

## 2018-07-16 DIAGNOSIS — I340 Nonrheumatic mitral (valve) insufficiency: Secondary | ICD-10-CM | POA: Diagnosis not present

## 2018-07-16 DIAGNOSIS — R9431 Abnormal electrocardiogram [ECG] [EKG]: Secondary | ICD-10-CM | POA: Diagnosis not present

## 2018-07-16 DIAGNOSIS — E1110 Type 2 diabetes mellitus with ketoacidosis without coma: Secondary | ICD-10-CM | POA: Diagnosis not present

## 2018-07-16 DIAGNOSIS — N179 Acute kidney failure, unspecified: Secondary | ICD-10-CM

## 2018-07-16 DIAGNOSIS — R001 Bradycardia, unspecified: Secondary | ICD-10-CM | POA: Diagnosis not present

## 2018-07-17 DIAGNOSIS — R9431 Abnormal electrocardiogram [ECG] [EKG]: Secondary | ICD-10-CM | POA: Diagnosis not present

## 2018-07-17 DIAGNOSIS — I10 Essential (primary) hypertension: Secondary | ICD-10-CM | POA: Diagnosis not present

## 2018-07-17 DIAGNOSIS — E785 Hyperlipidemia, unspecified: Secondary | ICD-10-CM | POA: Diagnosis not present

## 2018-07-17 DIAGNOSIS — E1165 Type 2 diabetes mellitus with hyperglycemia: Secondary | ICD-10-CM | POA: Diagnosis not present

## 2018-07-17 DIAGNOSIS — N179 Acute kidney failure, unspecified: Secondary | ICD-10-CM | POA: Diagnosis not present

## 2018-07-18 DIAGNOSIS — R110 Nausea: Secondary | ICD-10-CM | POA: Diagnosis not present

## 2018-07-18 DIAGNOSIS — E1165 Type 2 diabetes mellitus with hyperglycemia: Secondary | ICD-10-CM | POA: Diagnosis not present

## 2018-07-19 DIAGNOSIS — Z7982 Long term (current) use of aspirin: Secondary | ICD-10-CM | POA: Diagnosis not present

## 2018-07-19 DIAGNOSIS — E785 Hyperlipidemia, unspecified: Secondary | ICD-10-CM | POA: Diagnosis not present

## 2018-07-19 DIAGNOSIS — Z79899 Other long term (current) drug therapy: Secondary | ICD-10-CM | POA: Diagnosis not present

## 2018-07-19 DIAGNOSIS — E1165 Type 2 diabetes mellitus with hyperglycemia: Secondary | ICD-10-CM | POA: Diagnosis not present

## 2018-07-19 DIAGNOSIS — I10 Essential (primary) hypertension: Secondary | ICD-10-CM | POA: Diagnosis not present

## 2018-07-19 DIAGNOSIS — R001 Bradycardia, unspecified: Secondary | ICD-10-CM | POA: Diagnosis not present

## 2018-07-19 DIAGNOSIS — Z7984 Long term (current) use of oral hypoglycemic drugs: Secondary | ICD-10-CM | POA: Diagnosis not present

## 2018-07-19 DIAGNOSIS — Z794 Long term (current) use of insulin: Secondary | ICD-10-CM | POA: Diagnosis not present

## 2018-07-21 ENCOUNTER — Other Ambulatory Visit: Payer: Self-pay

## 2018-07-21 DIAGNOSIS — K5790 Diverticulosis of intestine, part unspecified, without perforation or abscess without bleeding: Secondary | ICD-10-CM | POA: Diagnosis not present

## 2018-07-21 DIAGNOSIS — R110 Nausea: Secondary | ICD-10-CM | POA: Diagnosis not present

## 2018-07-21 NOTE — Patient Outreach (Signed)
[place] [facility]) Care Management  07/21/2018  James Macias 1945/08/25 [id number]    Medication Adherence call to James Macias [employer] Ident[employer] Verify spoke with patient he is past due on Metformin 500 mg and Lisinopril 40 mg patient explain he is on his way to the hospital to call him at a later time. Patient is showing past due under [place].   [place] Management Direct Dial [phone]  Fax [phone] [email or web address]

## 2018-07-23 DIAGNOSIS — Z7984 Long term (current) use of oral hypoglycemic drugs: Secondary | ICD-10-CM | POA: Diagnosis not present

## 2018-07-23 DIAGNOSIS — Z79899 Other long term (current) drug therapy: Secondary | ICD-10-CM | POA: Diagnosis not present

## 2018-07-23 DIAGNOSIS — E785 Hyperlipidemia, unspecified: Secondary | ICD-10-CM | POA: Diagnosis not present

## 2018-07-23 DIAGNOSIS — I10 Essential (primary) hypertension: Secondary | ICD-10-CM | POA: Diagnosis not present

## 2018-07-23 DIAGNOSIS — Z7982 Long term (current) use of aspirin: Secondary | ICD-10-CM | POA: Diagnosis not present

## 2018-07-23 DIAGNOSIS — Z794 Long term (current) use of insulin: Secondary | ICD-10-CM | POA: Diagnosis not present

## 2018-07-23 DIAGNOSIS — R001 Bradycardia, unspecified: Secondary | ICD-10-CM | POA: Diagnosis not present

## 2018-07-23 DIAGNOSIS — E1165 Type 2 diabetes mellitus with hyperglycemia: Secondary | ICD-10-CM | POA: Diagnosis not present

## 2018-07-25 DIAGNOSIS — E1129 Type 2 diabetes mellitus with other diabetic kidney complication: Secondary | ICD-10-CM | POA: Diagnosis not present

## 2018-07-25 DIAGNOSIS — I7789 Other specified disorders of arteries and arterioles: Secondary | ICD-10-CM | POA: Diagnosis not present

## 2018-07-25 DIAGNOSIS — E1165 Type 2 diabetes mellitus with hyperglycemia: Secondary | ICD-10-CM | POA: Diagnosis not present

## 2018-07-30 DIAGNOSIS — Z7984 Long term (current) use of oral hypoglycemic drugs: Secondary | ICD-10-CM | POA: Diagnosis not present

## 2018-07-30 DIAGNOSIS — E785 Hyperlipidemia, unspecified: Secondary | ICD-10-CM | POA: Diagnosis not present

## 2018-07-30 DIAGNOSIS — Z79899 Other long term (current) drug therapy: Secondary | ICD-10-CM | POA: Diagnosis not present

## 2018-07-30 DIAGNOSIS — E1165 Type 2 diabetes mellitus with hyperglycemia: Secondary | ICD-10-CM | POA: Diagnosis not present

## 2018-07-30 DIAGNOSIS — Z7982 Long term (current) use of aspirin: Secondary | ICD-10-CM | POA: Diagnosis not present

## 2018-07-30 DIAGNOSIS — R001 Bradycardia, unspecified: Secondary | ICD-10-CM | POA: Diagnosis not present

## 2018-07-30 DIAGNOSIS — Z794 Long term (current) use of insulin: Secondary | ICD-10-CM | POA: Diagnosis not present

## 2018-07-30 DIAGNOSIS — I10 Essential (primary) hypertension: Secondary | ICD-10-CM | POA: Diagnosis not present

## 2018-08-06 DIAGNOSIS — Z794 Long term (current) use of insulin: Secondary | ICD-10-CM | POA: Diagnosis not present

## 2018-08-06 DIAGNOSIS — Z7982 Long term (current) use of aspirin: Secondary | ICD-10-CM | POA: Diagnosis not present

## 2018-08-06 DIAGNOSIS — E1165 Type 2 diabetes mellitus with hyperglycemia: Secondary | ICD-10-CM | POA: Diagnosis not present

## 2018-08-06 DIAGNOSIS — Z7984 Long term (current) use of oral hypoglycemic drugs: Secondary | ICD-10-CM | POA: Diagnosis not present

## 2018-08-06 DIAGNOSIS — Z79899 Other long term (current) drug therapy: Secondary | ICD-10-CM | POA: Diagnosis not present

## 2018-08-06 DIAGNOSIS — E785 Hyperlipidemia, unspecified: Secondary | ICD-10-CM | POA: Diagnosis not present

## 2018-08-06 DIAGNOSIS — I10 Essential (primary) hypertension: Secondary | ICD-10-CM | POA: Diagnosis not present

## 2018-08-06 DIAGNOSIS — R001 Bradycardia, unspecified: Secondary | ICD-10-CM | POA: Diagnosis not present

## 2018-08-08 DIAGNOSIS — E1165 Type 2 diabetes mellitus with hyperglycemia: Secondary | ICD-10-CM | POA: Diagnosis not present

## 2018-08-08 DIAGNOSIS — E1129 Type 2 diabetes mellitus with other diabetic kidney complication: Secondary | ICD-10-CM | POA: Diagnosis not present

## 2018-08-13 DIAGNOSIS — Z794 Long term (current) use of insulin: Secondary | ICD-10-CM | POA: Diagnosis not present

## 2018-08-13 DIAGNOSIS — I10 Essential (primary) hypertension: Secondary | ICD-10-CM | POA: Diagnosis not present

## 2018-08-13 DIAGNOSIS — Z7982 Long term (current) use of aspirin: Secondary | ICD-10-CM | POA: Diagnosis not present

## 2018-08-13 DIAGNOSIS — Z792 Long term (current) use of antibiotics: Secondary | ICD-10-CM | POA: Diagnosis not present

## 2018-08-13 DIAGNOSIS — E785 Hyperlipidemia, unspecified: Secondary | ICD-10-CM | POA: Diagnosis not present

## 2018-08-13 DIAGNOSIS — E1165 Type 2 diabetes mellitus with hyperglycemia: Secondary | ICD-10-CM | POA: Diagnosis not present

## 2018-08-13 DIAGNOSIS — G9341 Metabolic encephalopathy: Secondary | ICD-10-CM | POA: Diagnosis not present

## 2018-08-13 DIAGNOSIS — Z79899 Other long term (current) drug therapy: Secondary | ICD-10-CM | POA: Diagnosis not present

## 2018-08-13 DIAGNOSIS — E119 Type 2 diabetes mellitus without complications: Secondary | ICD-10-CM | POA: Diagnosis not present

## 2018-08-13 DIAGNOSIS — R001 Bradycardia, unspecified: Secondary | ICD-10-CM | POA: Diagnosis not present

## 2018-08-13 DIAGNOSIS — Z7984 Long term (current) use of oral hypoglycemic drugs: Secondary | ICD-10-CM | POA: Diagnosis not present

## 2018-08-13 DIAGNOSIS — J189 Pneumonia, unspecified organism: Secondary | ICD-10-CM | POA: Diagnosis not present

## 2018-08-14 DIAGNOSIS — R5383 Other fatigue: Secondary | ICD-10-CM | POA: Diagnosis not present

## 2018-08-14 DIAGNOSIS — I6521 Occlusion and stenosis of right carotid artery: Secondary | ICD-10-CM | POA: Diagnosis not present

## 2018-08-14 DIAGNOSIS — E1159 Type 2 diabetes mellitus with other circulatory complications: Secondary | ICD-10-CM | POA: Diagnosis not present

## 2018-08-14 DIAGNOSIS — R0902 Hypoxemia: Secondary | ICD-10-CM | POA: Diagnosis not present

## 2018-08-14 DIAGNOSIS — M79672 Pain in left foot: Secondary | ICD-10-CM | POA: Diagnosis not present

## 2018-08-14 DIAGNOSIS — Z7982 Long term (current) use of aspirin: Secondary | ICD-10-CM | POA: Diagnosis not present

## 2018-08-14 DIAGNOSIS — R404 Transient alteration of awareness: Secondary | ICD-10-CM | POA: Diagnosis not present

## 2018-08-14 DIAGNOSIS — R410 Disorientation, unspecified: Secondary | ICD-10-CM | POA: Diagnosis not present

## 2018-08-14 DIAGNOSIS — R509 Fever, unspecified: Secondary | ICD-10-CM | POA: Diagnosis not present

## 2018-08-14 DIAGNOSIS — R4182 Altered mental status, unspecified: Secondary | ICD-10-CM | POA: Diagnosis not present

## 2018-08-14 DIAGNOSIS — I700 Atherosclerosis of aorta: Secondary | ICD-10-CM | POA: Diagnosis not present

## 2018-08-14 DIAGNOSIS — I6523 Occlusion and stenosis of bilateral carotid arteries: Secondary | ICD-10-CM | POA: Diagnosis not present

## 2018-08-14 DIAGNOSIS — R531 Weakness: Secondary | ICD-10-CM | POA: Diagnosis not present

## 2018-08-14 DIAGNOSIS — K5730 Diverticulosis of large intestine without perforation or abscess without bleeding: Secondary | ICD-10-CM | POA: Diagnosis not present

## 2018-08-14 DIAGNOSIS — G9341 Metabolic encephalopathy: Secondary | ICD-10-CM | POA: Diagnosis not present

## 2018-08-14 DIAGNOSIS — J189 Pneumonia, unspecified organism: Secondary | ICD-10-CM | POA: Diagnosis not present

## 2018-08-14 DIAGNOSIS — I10 Essential (primary) hypertension: Secondary | ICD-10-CM | POA: Diagnosis not present

## 2018-08-14 DIAGNOSIS — Z452 Encounter for adjustment and management of vascular access device: Secondary | ICD-10-CM | POA: Diagnosis not present

## 2018-08-14 DIAGNOSIS — E1165 Type 2 diabetes mellitus with hyperglycemia: Secondary | ICD-10-CM | POA: Diagnosis not present

## 2018-08-14 DIAGNOSIS — R109 Unspecified abdominal pain: Secondary | ICD-10-CM | POA: Diagnosis not present

## 2018-08-14 DIAGNOSIS — R0602 Shortness of breath: Secondary | ICD-10-CM | POA: Diagnosis not present

## 2018-08-14 DIAGNOSIS — I517 Cardiomegaly: Secondary | ICD-10-CM | POA: Diagnosis not present

## 2018-08-14 DIAGNOSIS — Z7984 Long term (current) use of oral hypoglycemic drugs: Secondary | ICD-10-CM | POA: Diagnosis not present

## 2018-08-14 DIAGNOSIS — R069 Unspecified abnormalities of breathing: Secondary | ICD-10-CM | POA: Diagnosis not present

## 2018-08-14 DIAGNOSIS — I6381 Other cerebral infarction due to occlusion or stenosis of small artery: Secondary | ICD-10-CM | POA: Diagnosis not present

## 2018-08-14 DIAGNOSIS — M7989 Other specified soft tissue disorders: Secondary | ICD-10-CM | POA: Diagnosis not present

## 2018-08-14 DIAGNOSIS — I340 Nonrheumatic mitral (valve) insufficiency: Secondary | ICD-10-CM | POA: Diagnosis not present

## 2018-08-14 DIAGNOSIS — Z03818 Encounter for observation for suspected exposure to other biological agents ruled out: Secondary | ICD-10-CM | POA: Diagnosis not present

## 2018-08-15 DIAGNOSIS — R509 Fever, unspecified: Secondary | ICD-10-CM | POA: Diagnosis not present

## 2018-08-15 DIAGNOSIS — J189 Pneumonia, unspecified organism: Secondary | ICD-10-CM | POA: Diagnosis not present

## 2018-08-15 DIAGNOSIS — R4182 Altered mental status, unspecified: Secondary | ICD-10-CM | POA: Diagnosis not present

## 2018-08-15 DIAGNOSIS — E1165 Type 2 diabetes mellitus with hyperglycemia: Secondary | ICD-10-CM | POA: Diagnosis not present

## 2018-08-16 DIAGNOSIS — I517 Cardiomegaly: Secondary | ICD-10-CM | POA: Diagnosis not present

## 2018-08-16 DIAGNOSIS — R4182 Altered mental status, unspecified: Secondary | ICD-10-CM | POA: Diagnosis not present

## 2018-08-16 DIAGNOSIS — R509 Fever, unspecified: Secondary | ICD-10-CM | POA: Diagnosis not present

## 2018-08-16 DIAGNOSIS — J189 Pneumonia, unspecified organism: Secondary | ICD-10-CM | POA: Diagnosis not present

## 2018-08-16 DIAGNOSIS — I340 Nonrheumatic mitral (valve) insufficiency: Secondary | ICD-10-CM

## 2018-08-16 DIAGNOSIS — E1165 Type 2 diabetes mellitus with hyperglycemia: Secondary | ICD-10-CM | POA: Diagnosis not present

## 2018-08-17 DIAGNOSIS — J189 Pneumonia, unspecified organism: Secondary | ICD-10-CM | POA: Diagnosis not present

## 2018-08-17 DIAGNOSIS — E1165 Type 2 diabetes mellitus with hyperglycemia: Secondary | ICD-10-CM | POA: Diagnosis not present

## 2018-08-17 DIAGNOSIS — R509 Fever, unspecified: Secondary | ICD-10-CM | POA: Diagnosis not present

## 2018-08-17 DIAGNOSIS — R4182 Altered mental status, unspecified: Secondary | ICD-10-CM | POA: Diagnosis not present

## 2018-08-18 DIAGNOSIS — J189 Pneumonia, unspecified organism: Secondary | ICD-10-CM | POA: Diagnosis not present

## 2018-08-18 DIAGNOSIS — R509 Fever, unspecified: Secondary | ICD-10-CM | POA: Diagnosis not present

## 2018-08-18 DIAGNOSIS — R4182 Altered mental status, unspecified: Secondary | ICD-10-CM | POA: Diagnosis not present

## 2018-08-18 DIAGNOSIS — E1165 Type 2 diabetes mellitus with hyperglycemia: Secondary | ICD-10-CM | POA: Diagnosis not present

## 2018-08-20 DIAGNOSIS — E1165 Type 2 diabetes mellitus with hyperglycemia: Secondary | ICD-10-CM | POA: Diagnosis not present

## 2018-08-20 DIAGNOSIS — J189 Pneumonia, unspecified organism: Secondary | ICD-10-CM | POA: Diagnosis not present

## 2018-08-20 DIAGNOSIS — Z7689 Persons encountering health services in other specified circumstances: Secondary | ICD-10-CM | POA: Diagnosis not present

## 2018-08-20 DIAGNOSIS — M79675 Pain in left toe(s): Secondary | ICD-10-CM | POA: Diagnosis not present

## 2018-08-21 DIAGNOSIS — Z79899 Other long term (current) drug therapy: Secondary | ICD-10-CM | POA: Diagnosis not present

## 2018-08-21 DIAGNOSIS — Z7982 Long term (current) use of aspirin: Secondary | ICD-10-CM | POA: Diagnosis not present

## 2018-08-21 DIAGNOSIS — G9341 Metabolic encephalopathy: Secondary | ICD-10-CM | POA: Diagnosis not present

## 2018-08-21 DIAGNOSIS — Z792 Long term (current) use of antibiotics: Secondary | ICD-10-CM | POA: Diagnosis not present

## 2018-08-21 DIAGNOSIS — Z794 Long term (current) use of insulin: Secondary | ICD-10-CM | POA: Diagnosis not present

## 2018-08-21 DIAGNOSIS — I10 Essential (primary) hypertension: Secondary | ICD-10-CM | POA: Diagnosis not present

## 2018-08-21 DIAGNOSIS — E785 Hyperlipidemia, unspecified: Secondary | ICD-10-CM | POA: Diagnosis not present

## 2018-08-21 DIAGNOSIS — Z7984 Long term (current) use of oral hypoglycemic drugs: Secondary | ICD-10-CM | POA: Diagnosis not present

## 2018-08-21 DIAGNOSIS — E119 Type 2 diabetes mellitus without complications: Secondary | ICD-10-CM | POA: Diagnosis not present

## 2018-08-21 DIAGNOSIS — E1165 Type 2 diabetes mellitus with hyperglycemia: Secondary | ICD-10-CM | POA: Diagnosis not present

## 2018-08-21 DIAGNOSIS — R001 Bradycardia, unspecified: Secondary | ICD-10-CM | POA: Diagnosis not present

## 2018-08-21 DIAGNOSIS — J189 Pneumonia, unspecified organism: Secondary | ICD-10-CM | POA: Diagnosis not present

## 2018-08-27 DIAGNOSIS — E119 Type 2 diabetes mellitus without complications: Secondary | ICD-10-CM | POA: Diagnosis not present

## 2018-08-27 DIAGNOSIS — Z794 Long term (current) use of insulin: Secondary | ICD-10-CM | POA: Diagnosis not present

## 2018-08-27 DIAGNOSIS — R001 Bradycardia, unspecified: Secondary | ICD-10-CM | POA: Diagnosis not present

## 2018-08-27 DIAGNOSIS — Z79899 Other long term (current) drug therapy: Secondary | ICD-10-CM | POA: Diagnosis not present

## 2018-08-27 DIAGNOSIS — G9341 Metabolic encephalopathy: Secondary | ICD-10-CM | POA: Diagnosis not present

## 2018-08-27 DIAGNOSIS — Z792 Long term (current) use of antibiotics: Secondary | ICD-10-CM | POA: Diagnosis not present

## 2018-08-27 DIAGNOSIS — Z7982 Long term (current) use of aspirin: Secondary | ICD-10-CM | POA: Diagnosis not present

## 2018-08-27 DIAGNOSIS — Z7984 Long term (current) use of oral hypoglycemic drugs: Secondary | ICD-10-CM | POA: Diagnosis not present

## 2018-08-27 DIAGNOSIS — E1165 Type 2 diabetes mellitus with hyperglycemia: Secondary | ICD-10-CM | POA: Diagnosis not present

## 2018-08-27 DIAGNOSIS — I10 Essential (primary) hypertension: Secondary | ICD-10-CM | POA: Diagnosis not present

## 2018-08-27 DIAGNOSIS — E785 Hyperlipidemia, unspecified: Secondary | ICD-10-CM | POA: Diagnosis not present

## 2018-08-27 DIAGNOSIS — J189 Pneumonia, unspecified organism: Secondary | ICD-10-CM | POA: Diagnosis not present

## 2018-08-29 DIAGNOSIS — E1159 Type 2 diabetes mellitus with other circulatory complications: Secondary | ICD-10-CM | POA: Diagnosis not present

## 2018-09-05 DIAGNOSIS — E1159 Type 2 diabetes mellitus with other circulatory complications: Secondary | ICD-10-CM | POA: Diagnosis not present

## 2018-09-05 DIAGNOSIS — E1169 Type 2 diabetes mellitus with other specified complication: Secondary | ICD-10-CM | POA: Diagnosis not present

## 2018-09-08 DIAGNOSIS — E1165 Type 2 diabetes mellitus with hyperglycemia: Secondary | ICD-10-CM | POA: Diagnosis not present

## 2018-09-08 DIAGNOSIS — E1159 Type 2 diabetes mellitus with other circulatory complications: Secondary | ICD-10-CM | POA: Diagnosis not present

## 2018-09-08 DIAGNOSIS — I10 Essential (primary) hypertension: Secondary | ICD-10-CM | POA: Diagnosis not present

## 2018-09-17 DIAGNOSIS — I10 Essential (primary) hypertension: Secondary | ICD-10-CM | POA: Diagnosis not present

## 2018-09-17 DIAGNOSIS — E1159 Type 2 diabetes mellitus with other circulatory complications: Secondary | ICD-10-CM | POA: Diagnosis not present

## 2018-09-17 DIAGNOSIS — E1169 Type 2 diabetes mellitus with other specified complication: Secondary | ICD-10-CM | POA: Diagnosis not present

## 2018-09-17 DIAGNOSIS — E785 Hyperlipidemia, unspecified: Secondary | ICD-10-CM | POA: Diagnosis not present

## 2018-10-08 DIAGNOSIS — E785 Hyperlipidemia, unspecified: Secondary | ICD-10-CM | POA: Diagnosis not present

## 2018-10-08 DIAGNOSIS — E1159 Type 2 diabetes mellitus with other circulatory complications: Secondary | ICD-10-CM | POA: Diagnosis not present

## 2018-10-08 DIAGNOSIS — E1169 Type 2 diabetes mellitus with other specified complication: Secondary | ICD-10-CM | POA: Diagnosis not present

## 2018-10-08 DIAGNOSIS — I10 Essential (primary) hypertension: Secondary | ICD-10-CM | POA: Diagnosis not present

## 2018-11-04 ENCOUNTER — Other Ambulatory Visit: Payer: Self-pay

## 2018-11-04 NOTE — Patient Outreach (Signed)
[place] [facility]) Care Management  11/04/2018  James Macias 18-May-1945 [id number]  Medication Adherence call to James Macias [employer] Ident[employer] Verify spoke with patient he is past due on Pravastatin 20 mg ,patient explain he takes 1 tablet once a week,patient receives a pill pack every month on the 1st of the month,patient has medication until he receives other pill pack, James Macias is showing past due under [place] [employer] Ins.   [place] Management Direct Dial [phone]  Fax [phone] [email or web address]

## 2018-11-07 DIAGNOSIS — E1159 Type 2 diabetes mellitus with other circulatory complications: Secondary | ICD-10-CM | POA: Diagnosis not present

## 2018-11-07 DIAGNOSIS — E1169 Type 2 diabetes mellitus with other specified complication: Secondary | ICD-10-CM | POA: Diagnosis not present

## 2018-11-07 DIAGNOSIS — I10 Essential (primary) hypertension: Secondary | ICD-10-CM | POA: Diagnosis not present

## 2018-11-07 DIAGNOSIS — E785 Hyperlipidemia, unspecified: Secondary | ICD-10-CM | POA: Diagnosis not present

## 2018-11-18 DIAGNOSIS — E1165 Type 2 diabetes mellitus with hyperglycemia: Secondary | ICD-10-CM | POA: Diagnosis not present

## 2018-11-18 DIAGNOSIS — Z23 Encounter for immunization: Secondary | ICD-10-CM | POA: Diagnosis not present

## 2018-12-08 DIAGNOSIS — I10 Essential (primary) hypertension: Secondary | ICD-10-CM | POA: Diagnosis not present

## 2018-12-08 DIAGNOSIS — E1159 Type 2 diabetes mellitus with other circulatory complications: Secondary | ICD-10-CM | POA: Diagnosis not present

## 2018-12-08 DIAGNOSIS — E1165 Type 2 diabetes mellitus with hyperglycemia: Secondary | ICD-10-CM | POA: Diagnosis not present

## 2018-12-08 DIAGNOSIS — E785 Hyperlipidemia, unspecified: Secondary | ICD-10-CM | POA: Diagnosis not present

## 2019-01-07 DIAGNOSIS — E1169 Type 2 diabetes mellitus with other specified complication: Secondary | ICD-10-CM | POA: Diagnosis not present

## 2019-01-07 DIAGNOSIS — E1165 Type 2 diabetes mellitus with hyperglycemia: Secondary | ICD-10-CM | POA: Diagnosis not present

## 2019-01-07 DIAGNOSIS — E1159 Type 2 diabetes mellitus with other circulatory complications: Secondary | ICD-10-CM | POA: Diagnosis not present

## 2019-01-07 DIAGNOSIS — E785 Hyperlipidemia, unspecified: Secondary | ICD-10-CM | POA: Diagnosis not present

## 2019-01-07 DIAGNOSIS — I10 Essential (primary) hypertension: Secondary | ICD-10-CM | POA: Diagnosis not present

## 2019-02-09 DEATH — deceased
